# Patient Record
Sex: Male | Born: 1990 | Race: Black or African American | Hispanic: No | Marital: Single
Health system: Southern US, Community
[De-identification: ages and names within clinical notes are randomized; demographics above are authoritative.]

---
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
## 2012-10-07 ENCOUNTER — Ambulatory Visit
Admission: RE | Admit: 2012-10-07 | Discharge: 2012-10-07 | Disposition: A | Discharge status: Home / Self Care | Payer: No Typology Code available for payment source | Source: Ambulatory Visit | Attending: Occupational Medicine | Admitting: Occupational Medicine

## 2012-10-07 ENCOUNTER — Other Ambulatory Visit: Payer: Self-pay | Admitting: Occupational Medicine

## 2012-10-07 DIAGNOSIS — Z021 Encounter for pre-employment examination: Secondary | ICD-10-CM

## 2017-03-05 ENCOUNTER — Encounter (HOSPITAL_COMMUNITY): Payer: Self-pay | Admitting: Emergency Medicine

## 2017-03-05 ENCOUNTER — Other Ambulatory Visit: Payer: Self-pay

## 2017-03-05 ENCOUNTER — Emergency Department (HOSPITAL_COMMUNITY): Payer: Self-pay

## 2017-03-05 ENCOUNTER — Emergency Department (HOSPITAL_COMMUNITY)
Admission: EM | Admit: 2017-03-05 | Discharge: 2017-03-06 | Disposition: A | Discharge status: Home / Self Care | Payer: Self-pay | Attending: Emergency Medicine | Admitting: Emergency Medicine

## 2017-03-05 DIAGNOSIS — Y939 Activity, unspecified: Secondary | ICD-10-CM | POA: Insufficient documentation

## 2017-03-05 DIAGNOSIS — E860 Dehydration: Secondary | ICD-10-CM | POA: Insufficient documentation

## 2017-03-05 DIAGNOSIS — W3400XA Accidental discharge from unspecified firearms or gun, initial encounter: Secondary | ICD-10-CM | POA: Insufficient documentation

## 2017-03-05 DIAGNOSIS — Y929 Unspecified place or not applicable: Secondary | ICD-10-CM | POA: Insufficient documentation

## 2017-03-05 DIAGNOSIS — S79922A Unspecified injury of left thigh, initial encounter: Secondary | ICD-10-CM | POA: Insufficient documentation

## 2017-03-05 DIAGNOSIS — R42 Dizziness and giddiness: Secondary | ICD-10-CM | POA: Insufficient documentation

## 2017-03-05 DIAGNOSIS — Z23 Encounter for immunization: Secondary | ICD-10-CM | POA: Insufficient documentation

## 2017-03-05 DIAGNOSIS — Y998 Other external cause status: Secondary | ICD-10-CM | POA: Insufficient documentation

## 2017-03-05 LAB — BPAM RBC
BLOOD PRODUCT EXPIRATION DATE: 201812192359
Blood Product Expiration Date: 201812212359
ISSUE DATE / TIME: 201812061855
ISSUE DATE / TIME: 201812061855
UNIT TYPE AND RH: 9500
UNIT TYPE AND RH: 9500

## 2017-03-05 LAB — PREPARE FRESH FROZEN PLASMA
UNIT DIVISION: 0
Unit division: 0

## 2017-03-05 LAB — CBC
HEMATOCRIT: 39.6 % (ref 39.0–52.0)
HEMOGLOBIN: 12.6 g/dL — AB (ref 13.0–17.0)
MCH: 28.9 pg (ref 26.0–34.0)
MCHC: 31.8 g/dL (ref 30.0–36.0)
MCV: 90.8 fL (ref 78.0–100.0)
Platelets: 289 10*3/uL (ref 150–400)
RBC: 4.36 MIL/uL (ref 4.22–5.81)
RDW: 12.8 % (ref 11.5–15.5)
WBC: 7 10*3/uL (ref 4.0–10.5)

## 2017-03-05 LAB — COMPREHENSIVE METABOLIC PANEL
ALBUMIN: 3.7 g/dL (ref 3.5–5.0)
ALK PHOS: 74 U/L (ref 38–126)
ALT: 17 U/L (ref 17–63)
ANION GAP: 22 — AB (ref 5–15)
AST: 35 U/L (ref 15–41)
BILIRUBIN TOTAL: 0.6 mg/dL (ref 0.3–1.2)
BUN: 10 mg/dL (ref 6–20)
CALCIUM: 8.6 mg/dL — AB (ref 8.9–10.3)
CO2: 13 mmol/L — AB (ref 22–32)
Chloride: 105 mmol/L (ref 101–111)
Creatinine, Ser: 1.65 mg/dL — ABNORMAL HIGH (ref 0.61–1.24)
GFR calc non Af Amer: 56 mL/min — ABNORMAL LOW (ref >60)
GLUCOSE: 173 mg/dL — AB (ref 65–99)
POTASSIUM: 3.3 mmol/L — AB (ref 3.5–5.1)
SODIUM: 140 mmol/L (ref 135–145)
TOTAL PROTEIN: 6.5 g/dL (ref 6.5–8.1)

## 2017-03-05 LAB — TYPE AND SCREEN
ABO/RH(D): B POS
ANTIBODY SCREEN: NEGATIVE
UNIT DIVISION: 0
Unit division: 0

## 2017-03-05 LAB — BPAM FFP
Blood Product Expiration Date: 201812112359
Blood Product Expiration Date: 201812112359
ISSUE DATE / TIME: 201812061855
ISSUE DATE / TIME: 201812061855
Unit Type and Rh: 6200
Unit Type and Rh: 6200

## 2017-03-05 LAB — I-STAT CG4 LACTIC ACID, ED
LACTIC ACID, VENOUS: 16.49 mmol/L — AB (ref 0.5–1.9)
Lactic Acid, Venous: 2.59 mmol/L (ref 0.5–1.9)

## 2017-03-05 LAB — I-STAT CHEM 8, ED
BUN: 9 mg/dL (ref 6–20)
CALCIUM ION: 1.11 mmol/L — AB (ref 1.15–1.40)
CHLORIDE: 103 mmol/L (ref 101–111)
Creatinine, Ser: 1.3 mg/dL — ABNORMAL HIGH (ref 0.61–1.24)
GLUCOSE: 160 mg/dL — AB (ref 65–99)
HCT: 41 % (ref 39.0–52.0)
Hemoglobin: 13.9 g/dL (ref 13.0–17.0)
Potassium: 3.1 mmol/L — ABNORMAL LOW (ref 3.5–5.1)
SODIUM: 142 mmol/L (ref 135–145)
TCO2: 15 mmol/L — AB (ref 22–32)

## 2017-03-05 LAB — PROTIME-INR
INR: 1.2
PROTHROMBIN TIME: 15.1 s (ref 11.4–15.2)

## 2017-03-05 LAB — ETHANOL

## 2017-03-05 LAB — CDS SEROLOGY

## 2017-03-05 LAB — ABO/RH: ABO/RH(D): B POS

## 2017-03-05 MED ORDER — IOPAMIDOL (ISOVUE-370) INJECTION 76%
100.0000 mL | Freq: Once | INTRAVENOUS | Status: AC | PRN
  Administered 2017-03-05: 100 mL via INTRAVENOUS

## 2017-03-05 MED ORDER — MORPHINE SULFATE (PF) 4 MG/ML IV SOLN
4.0000 mg | Freq: Once | INTRAVENOUS | Status: AC
  Administered 2017-03-05: 4 mg via INTRAVENOUS
  Filled 2017-03-05: qty 1

## 2017-03-05 MED ORDER — SODIUM CHLORIDE 0.9 % IV BOLUS (SEPSIS)
1000.0000 mL | Freq: Once | INTRAVENOUS | Status: AC
  Administered 2017-03-05: 1000 mL via INTRAVENOUS

## 2017-03-05 MED ORDER — LACTATED RINGERS IV BOLUS (SEPSIS)
1000.0000 mL | Freq: Once | INTRAVENOUS | Status: AC
  Administered 2017-03-06: 1000 mL via INTRAVENOUS

## 2017-03-05 MED ORDER — MORPHINE SULFATE 2 MG/ML IJ SOLN
INTRAMUSCULAR | Status: AC | PRN
  Administered 2017-03-05: 4 mg via INTRAVENOUS

## 2017-03-05 MED ORDER — IOPAMIDOL (ISOVUE-370) INJECTION 76%
INTRAVENOUS | Status: AC
  Filled 2017-03-05: qty 100

## 2017-03-05 MED ORDER — MORPHINE SULFATE (PF) 4 MG/ML IV SOLN
INTRAVENOUS | Status: AC
  Administered 2017-03-05: 20:00:00
  Filled 2017-03-05: qty 1
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
## 2017-03-06 LAB — CBC WITH DIFFERENTIAL/PLATELET
BASOS PCT: 0 %
Basophils Absolute: 0 10*3/uL (ref 0.0–0.1)
EOS PCT: 0 %
Eosinophils Absolute: 0 10*3/uL (ref 0.0–0.7)
HEMATOCRIT: 30.7 % — AB (ref 39.0–52.0)
Hemoglobin: 10.3 g/dL — ABNORMAL LOW (ref 13.0–17.0)
Lymphocytes Relative: 6 %
Lymphs Abs: 0.8 10*3/uL (ref 0.7–4.0)
MCH: 29.1 pg (ref 26.0–34.0)
MCHC: 33.6 g/dL (ref 30.0–36.0)
MCV: 86.7 fL (ref 78.0–100.0)
MONO ABS: 0.7 10*3/uL (ref 0.1–1.0)
MONOS PCT: 5 %
NEUTROS ABS: 11.6 10*3/uL — AB (ref 1.7–7.7)
Neutrophils Relative %: 89 %
Platelets: 210 10*3/uL (ref 150–400)
RBC: 3.54 MIL/uL — ABNORMAL LOW (ref 4.22–5.81)
RDW: 12.9 % (ref 11.5–15.5)
WBC: 13.1 10*3/uL — ABNORMAL HIGH (ref 4.0–10.5)

## 2017-03-06 LAB — URINALYSIS, ROUTINE W REFLEX MICROSCOPIC
BILIRUBIN URINE: NEGATIVE
Bacteria, UA: NONE SEEN
Glucose, UA: NEGATIVE mg/dL
KETONES UR: NEGATIVE mg/dL
LEUKOCYTES UA: NEGATIVE
Nitrite: NEGATIVE
PH: 5 (ref 5.0–8.0)
Protein, ur: NEGATIVE mg/dL
WBC UA: NONE SEEN WBC/hpf (ref 0–5)

## 2017-03-06 LAB — BASIC METABOLIC PANEL
Anion gap: 6 (ref 5–15)
BUN: 9 mg/dL (ref 6–20)
CALCIUM: 7.3 mg/dL — AB (ref 8.9–10.3)
CO2: 22 mmol/L (ref 22–32)
CREATININE: 1 mg/dL (ref 0.61–1.24)
Chloride: 110 mmol/L (ref 101–111)
GFR calc non Af Amer: >60 mL/min (ref >60)
GLUCOSE: 126 mg/dL — AB (ref 65–99)
Potassium: 3.8 mmol/L (ref 3.5–5.1)
Sodium: 138 mmol/L (ref 135–145)

## 2017-03-06 MED ORDER — METHOCARBAMOL 500 MG PO TABS: 1000.0000 mg | ORAL_TABLET | Freq: Four times a day (QID) | ORAL | 0 refills | Status: AC | PRN

## 2017-03-06 MED ORDER — TETANUS-DIPHTH-ACELL PERTUSSIS 5-2.5-18.5 LF-MCG/0.5 IM SUSP
0.5000 mL | Freq: Once | INTRAMUSCULAR | Status: AC
  Administered 2017-03-06: 0.5 mL via INTRAMUSCULAR
  Filled 2017-03-06: qty 0.5

## 2017-03-06 NOTE — Discharge Instructions (Signed)
Your kidney function has normalized. Please also take 1000mg  tylenol and 400mg  of motrin every 6hours for pain. Please ensure you are not getting tylenol from any other source while taking this. Please return for fever, drainage, redness or inability to control your pain.

## 2017-03-06 NOTE — ED Notes (Signed)
Pt able to stand on uninjured leg, pt can not weight bear on injured leg. Patient states he felt dizzy upon standing.

## 2017-03-06 NOTE — ED Notes (Signed)
Pt ambulated around bet, putting weight on affected limb with limp. Denies dizziness/lightheadedness.

## 2017-03-12 ENCOUNTER — Emergency Department (HOSPITAL_COMMUNITY): Payer: Self-pay

## 2017-03-12 ENCOUNTER — Other Ambulatory Visit: Payer: Self-pay

## 2017-03-12 ENCOUNTER — Emergency Department (HOSPITAL_COMMUNITY)
Admission: EM | Admit: 2017-03-12 | Discharge: 2017-03-12 | Disposition: A | Discharge status: Home / Self Care | Payer: Self-pay | Attending: Emergency Medicine | Admitting: Emergency Medicine

## 2017-03-12 ENCOUNTER — Emergency Department (HOSPITAL_BASED_OUTPATIENT_CLINIC_OR_DEPARTMENT_OTHER): Admit: 2017-03-12 | Discharge: 2017-03-12 | Disposition: A | Discharge status: Home / Self Care | Payer: Self-pay

## 2017-03-12 DIAGNOSIS — M79605 Pain in left leg: Secondary | ICD-10-CM | POA: Insufficient documentation

## 2017-03-12 DIAGNOSIS — M7989 Other specified soft tissue disorders: Secondary | ICD-10-CM

## 2017-03-12 MED ORDER — TRAMADOL HCL 50 MG PO TABS: 50.0000 mg | ORAL_TABLET | Freq: Four times a day (QID) | ORAL | 0 refills | Status: AC | PRN
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
## 2017-03-12 NOTE — ED Notes (Signed)
Patient transported to X-ray 

## 2017-03-12 NOTE — ED Notes (Signed)
Pt oob to BR with steady gait. 

## 2017-03-12 NOTE — Progress Notes (Addendum)
VASCULAR LAB PRELIMINARY  PRELIMINARY  PRELIMINARY  PRELIMINARY  Left lower extremity venous  completed.    Preliminary report:  There is no DVT or SVT noted in the left lower extremity.  Enlarged inguinal lymph node noted.   Called results to Leory PlowmanAlex  Shedrick Sarli, [facility] More[facility] ValleyCANDACE, RVT 03/12/2017, 12:32 PM

## 2017-03-12 NOTE — Discharge Instructions (Signed)
Please read attached information. If you experience any new or worsening signs or symptoms please return to the emergency room for evaluation. Please follow-up with your primary care provider or specialist as discussed. Please use medication prescribed only as directed and discontinue taking if you have any concerning signs or symptoms.   °
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
